# Patient Record
Sex: Male | Born: 1965 | Race: White | Hispanic: No | Smoking: Current every day smoker
Health system: Southern US, Community
[De-identification: ages and names within clinical notes are randomized; demographics above are authoritative.]

## PROBLEM LIST (undated history)

## (undated) DIAGNOSIS — K219 Gastro-esophageal reflux disease without esophagitis: Secondary | ICD-10-CM

## (undated) HISTORY — PX: ANKLE FRACTURE SURGERY: SHX122

## (undated) HISTORY — PX: HAND SURGERY: SHX662

---

## 2016-08-16 ENCOUNTER — Other Ambulatory Visit: Payer: Self-pay | Admitting: Orthopedic Surgery

## 2016-09-21 ENCOUNTER — Encounter (HOSPITAL_COMMUNITY)
Admission: RE | Admit: 2016-09-21 | Discharge: 2016-09-21 | Disposition: A | Discharge status: Home / Self Care | Payer: Worker's Compensation | Source: Ambulatory Visit | Attending: Orthopedic Surgery | Admitting: Orthopedic Surgery

## 2016-09-21 ENCOUNTER — Encounter (HOSPITAL_COMMUNITY): Payer: Self-pay

## 2016-09-21 ENCOUNTER — Ambulatory Visit (HOSPITAL_COMMUNITY)
Admission: RE | Admit: 2016-09-21 | Discharge: 2016-09-21 | Disposition: A | Discharge status: Home / Self Care | Payer: Worker's Compensation | Source: Ambulatory Visit | Attending: Orthopedic Surgery | Admitting: Orthopedic Surgery

## 2016-09-21 DIAGNOSIS — Z01810 Encounter for preprocedural cardiovascular examination: Secondary | ICD-10-CM | POA: Diagnosis not present

## 2016-09-21 DIAGNOSIS — Z01818 Encounter for other preprocedural examination: Secondary | ICD-10-CM

## 2016-09-21 DIAGNOSIS — M5412 Radiculopathy, cervical region: Secondary | ICD-10-CM | POA: Diagnosis not present

## 2016-09-21 HISTORY — DX: Gastro-esophageal reflux disease without esophagitis: K21.9

## 2016-09-21 LAB — COMPREHENSIVE METABOLIC PANEL
ALK PHOS: 41 U/L (ref 38–126)
ALT: 31 U/L (ref 17–63)
AST: 30 U/L (ref 15–41)
Albumin: 4.5 g/dL (ref 3.5–5.0)
Anion gap: 14 (ref 5–15)
BUN: 9 mg/dL (ref 6–20)
CALCIUM: 10 mg/dL (ref 8.9–10.3)
CO2: 22 mmol/L (ref 22–32)
CREATININE: 0.87 mg/dL (ref 0.61–1.24)
Chloride: 105 mmol/L (ref 101–111)
Glucose, Bld: 96 mg/dL (ref 65–99)
Potassium: 4.2 mmol/L (ref 3.5–5.1)
SODIUM: 141 mmol/L (ref 135–145)
Total Bilirubin: 0.6 mg/dL (ref 0.3–1.2)
Total Protein: 7.3 g/dL (ref 6.5–8.1)

## 2016-09-21 LAB — URINALYSIS, ROUTINE W REFLEX MICROSCOPIC
Bacteria, UA: NONE SEEN
Bilirubin Urine: NEGATIVE
GLUCOSE, UA: NEGATIVE mg/dL
Hgb urine dipstick: NEGATIVE
Ketones, ur: NEGATIVE mg/dL
Leukocytes, UA: NEGATIVE
Nitrite: NEGATIVE
Protein, ur: NEGATIVE mg/dL
SQUAMOUS EPITHELIAL / LPF: NONE SEEN
Specific Gravity, Urine: 1.019 (ref 1.005–1.030)
pH: 6 (ref 5.0–8.0)

## 2016-09-21 LAB — CBC WITH DIFFERENTIAL/PLATELET
Basophils Absolute: 0 10*3/uL (ref 0.0–0.1)
Basophils Relative: 1 %
Eosinophils Absolute: 0.3 10*3/uL (ref 0.0–0.7)
Eosinophils Relative: 4 %
HEMATOCRIT: 47 % (ref 39.0–52.0)
HEMOGLOBIN: 16.4 g/dL (ref 13.0–17.0)
LYMPHS ABS: 2 10*3/uL (ref 0.7–4.0)
LYMPHS PCT: 30 %
MCH: 31.5 pg (ref 26.0–34.0)
MCHC: 34.9 g/dL (ref 30.0–36.0)
MCV: 90.2 fL (ref 78.0–100.0)
Monocytes Absolute: 0.6 10*3/uL (ref 0.1–1.0)
Monocytes Relative: 9 %
NEUTROS ABS: 3.8 10*3/uL (ref 1.7–7.7)
NEUTROS PCT: 56 %
Platelets: 231 10*3/uL (ref 150–400)
RBC: 5.21 MIL/uL (ref 4.22–5.81)
RDW: 12.5 % (ref 11.5–15.5)
WBC: 6.6 10*3/uL (ref 4.0–10.5)

## 2016-09-21 LAB — PROTIME-INR
INR: 0.88
Prothrombin Time: 11.9 seconds (ref 11.4–15.2)

## 2016-09-21 LAB — APTT: aPTT: 27 seconds (ref 24–36)

## 2016-09-21 LAB — SURGICAL PCR SCREEN
MRSA, PCR: NEGATIVE
Staphylococcus aureus: NEGATIVE

## 2016-09-21 NOTE — Pre-Procedure Instructions (Signed)
    Filbert BertholdDaniel Bondar  09/21/2016      [facility] [place] [address] [place] [id number] Phone: [phone] Fax: [phone]    Your procedure is scheduled on 09/27/16.  [facility] at [facility].M.  Call this number if you have problems the morning of surgery:  [phone]   Remember:  Do not eat food or drink liquids after midnight.  Take these medicines the morning of surgery with A SIP OF WATER --ultram,zantac   Do not wear jewelry, make-up or nail polish.  Do not wear lotions, powders, or perfumes, or deoderant.  Do not shave 48 hours prior to surgery.  Men may shave face and neck.  Do not bring valuables to the hospital.  [facility] is not responsible for any belongings or valuables.  Contacts, dentures or bridgework may not be worn into surgery.  Leave your suitcase in the car.  After surgery it may be brought to your room.  For patients admitted to the hospital, discharge time will be determined by your treatment team.  Patients discharged the day of surgery will not be allowed to drive home.   Name and phone number of your driver:   Special instructions:  Do not take any aspirin,anti-inflammatories,vitamins,or herbal supplements 5-7 days prior to surgery.  Please read over the following fact sheets that you were given. MRSA Information

## 2016-09-27 ENCOUNTER — Encounter (HOSPITAL_COMMUNITY)
Admission: RE | Disposition: A | Discharge status: Home / Self Care | Payer: Self-pay | Source: Ambulatory Visit | Attending: Orthopedic Surgery

## 2016-09-27 ENCOUNTER — Ambulatory Visit (HOSPITAL_COMMUNITY): Payer: Worker's Compensation | Admitting: Anesthesiology

## 2016-09-27 ENCOUNTER — Ambulatory Visit (HOSPITAL_COMMUNITY): Payer: Worker's Compensation

## 2016-09-27 ENCOUNTER — Encounter (HOSPITAL_COMMUNITY): Payer: Self-pay | Admitting: *Deleted

## 2016-09-27 ENCOUNTER — Observation Stay (HOSPITAL_COMMUNITY)
Admission: RE | Admit: 2016-09-27 | Discharge: 2016-09-28 | Disposition: A | Discharge status: Home / Self Care | Payer: Worker's Compensation | Source: Ambulatory Visit | Attending: Orthopedic Surgery | Admitting: Orthopedic Surgery

## 2016-09-27 DIAGNOSIS — F17290 Nicotine dependence, other tobacco product, uncomplicated: Secondary | ICD-10-CM | POA: Diagnosis not present

## 2016-09-27 DIAGNOSIS — Z419 Encounter for procedure for purposes other than remedying health state, unspecified: Secondary | ICD-10-CM

## 2016-09-27 DIAGNOSIS — M50123 Cervical disc disorder at C6-C7 level with radiculopathy: Principal | ICD-10-CM | POA: Insufficient documentation

## 2016-09-27 DIAGNOSIS — K219 Gastro-esophageal reflux disease without esophagitis: Secondary | ICD-10-CM | POA: Insufficient documentation

## 2016-09-27 DIAGNOSIS — M5410 Radiculopathy, site unspecified: Secondary | ICD-10-CM | POA: Diagnosis present

## 2016-09-27 HISTORY — PX: ANTERIOR CERVICAL DECOMP/DISCECTOMY FUSION: SHX1161

## 2016-09-27 SURGERY — ANTERIOR CERVICAL DECOMPRESSION/DISCECTOMY FUSION 3 LEVELS
Anesthesia: General | Site: Neck

## 2016-09-27 MED ORDER — ACETAMINOPHEN 325 MG PO TABS: 650.0000 mg | ORAL_TABLET | ORAL | Status: DC | PRN

## 2016-09-27 MED ORDER — MIDAZOLAM HCL 2 MG/2ML IJ SOLN
INTRAMUSCULAR | Status: AC
  Administered 2016-09-27: 2 mg via INTRAVENOUS
  Filled 2016-09-27: qty 2

## 2016-09-27 MED ORDER — FLEET ENEMA 7-19 GM/118ML RE ENEM: 1.0000 | ENEMA | Freq: Once | RECTAL | Status: DC | PRN

## 2016-09-27 MED ORDER — OXYCODONE-ACETAMINOPHEN 5-325 MG PO TABS
1.0000 | ORAL_TABLET | ORAL | Status: DC | PRN
  Administered 2016-09-28: 2 via ORAL
  Filled 2016-09-27: qty 2

## 2016-09-27 MED ORDER — CEFAZOLIN SODIUM-DEXTROSE 2-4 GM/100ML-% IV SOLN
INTRAVENOUS | Status: AC
  Filled 2016-09-27: qty 100

## 2016-09-27 MED ORDER — LACTATED RINGERS IV SOLN
INTRAVENOUS | Status: DC
  Administered 2016-09-27: 11:00:00 via INTRAVENOUS

## 2016-09-27 MED ORDER — SODIUM CHLORIDE 0.9 % IV SOLN
250.0000 mL | INTRAVENOUS | Status: DC
  Administered 2016-09-27: 250 mL via INTRAVENOUS

## 2016-09-27 MED ORDER — SENNOSIDES-DOCUSATE SODIUM 8.6-50 MG PO TABS: 1.0000 | ORAL_TABLET | Freq: Every evening | ORAL | Status: DC | PRN

## 2016-09-27 MED ORDER — DIAZEPAM 5 MG PO TABS
5.0000 mg | ORAL_TABLET | Freq: Four times a day (QID) | ORAL | Status: DC | PRN
  Administered 2016-09-27: 5 mg via ORAL

## 2016-09-27 MED ORDER — ALUM & MAG HYDROXIDE-SIMETH 200-200-20 MG/5ML PO SUSP: 30.0000 mL | Freq: Four times a day (QID) | ORAL | Status: DC | PRN

## 2016-09-27 MED ORDER — THROMBIN 20000 UNITS EX SOLR
CUTANEOUS | Status: AC
  Filled 2016-09-27: qty 20000

## 2016-09-27 MED ORDER — CEFAZOLIN SODIUM-DEXTROSE 2-4 GM/100ML-% IV SOLN
2.0000 g | Freq: Three times a day (TID) | INTRAVENOUS | Status: AC
  Administered 2016-09-27 – 2016-09-28 (×2): 2 g via INTRAVENOUS
  Filled 2016-09-27 (×2): qty 100

## 2016-09-27 MED ORDER — HYDROMORPHONE HCL 1 MG/ML IJ SOLN
INTRAMUSCULAR | Status: AC
  Filled 2016-09-27: qty 0.5

## 2016-09-27 MED ORDER — ONDANSETRON HCL 4 MG/2ML IJ SOLN
INTRAMUSCULAR | Status: DC | PRN
  Administered 2016-09-27: 4 mg via INTRAVENOUS

## 2016-09-27 MED ORDER — EPINEPHRINE PF 1 MG/ML IJ SOLN
INTRAMUSCULAR | Status: AC
  Filled 2016-09-27: qty 1

## 2016-09-27 MED ORDER — DIAZEPAM 5 MG PO TABS
ORAL_TABLET | ORAL | Status: AC
  Filled 2016-09-27: qty 1

## 2016-09-27 MED ORDER — MIDAZOLAM HCL 2 MG/2ML IJ SOLN
INTRAMUSCULAR | Status: AC
  Filled 2016-09-27: qty 2

## 2016-09-27 MED ORDER — PROPOFOL 10 MG/ML IV BOLUS
INTRAVENOUS | Status: DC | PRN
  Administered 2016-09-27: 30 mg via INTRAVENOUS
  Administered 2016-09-27: 40 mg via INTRAVENOUS
  Administered 2016-09-27: 130 mg via INTRAVENOUS

## 2016-09-27 MED ORDER — BISACODYL 5 MG PO TBEC: 5.0000 mg | DELAYED_RELEASE_TABLET | Freq: Every day | ORAL | Status: DC | PRN

## 2016-09-27 MED ORDER — FENTANYL CITRATE (PF) 100 MCG/2ML IJ SOLN
INTRAMUSCULAR | Status: AC
  Filled 2016-09-27: qty 2

## 2016-09-27 MED ORDER — LIDOCAINE HCL (CARDIAC) 20 MG/ML IV SOLN
INTRAVENOUS | Status: DC | PRN
  Administered 2016-09-27: 40 mg via INTRAVENOUS

## 2016-09-27 MED ORDER — HEMOSTATIC AGENTS (NO CHARGE) OPTIME
TOPICAL | Status: DC | PRN
  Administered 2016-09-27: 1 via TOPICAL

## 2016-09-27 MED ORDER — CEFAZOLIN SODIUM-DEXTROSE 2-4 GM/100ML-% IV SOLN
2.0000 g | INTRAVENOUS | Status: AC
  Administered 2016-09-27: 2 g via INTRAVENOUS

## 2016-09-27 MED ORDER — HYDROMORPHONE HCL 1 MG/ML IJ SOLN
0.2500 mg | INTRAMUSCULAR | Status: DC | PRN
  Administered 2016-09-27 (×4): 0.5 mg via INTRAVENOUS

## 2016-09-27 MED ORDER — PROPOFOL 10 MG/ML IV BOLUS
INTRAVENOUS | Status: AC
  Filled 2016-09-27: qty 20

## 2016-09-27 MED ORDER — BUPIVACAINE HCL (PF) 0.25 % IJ SOLN
INTRAMUSCULAR | Status: AC
  Filled 2016-09-27: qty 30

## 2016-09-27 MED ORDER — BUPIVACAINE-EPINEPHRINE 0.25% -1:200000 IJ SOLN
INTRAMUSCULAR | Status: DC | PRN
  Administered 2016-09-27: 10 mL

## 2016-09-27 MED ORDER — HYDROCODONE-ACETAMINOPHEN 5-325 MG PO TABS: 1.0000 | ORAL_TABLET | ORAL | Status: DC | PRN

## 2016-09-27 MED ORDER — ROCURONIUM BROMIDE 50 MG/5ML IV SOSY
PREFILLED_SYRINGE | INTRAVENOUS | Status: AC
  Filled 2016-09-27: qty 5

## 2016-09-27 MED ORDER — 0.9 % SODIUM CHLORIDE (POUR BTL) OPTIME
TOPICAL | Status: DC | PRN
  Administered 2016-09-27 (×2): 1000 mL

## 2016-09-27 MED ORDER — ONDANSETRON HCL 4 MG/2ML IJ SOLN: 4.0000 mg | INTRAMUSCULAR | Status: DC | PRN

## 2016-09-27 MED ORDER — LIDOCAINE 2% (20 MG/ML) 5 ML SYRINGE
INTRAMUSCULAR | Status: AC
  Filled 2016-09-27: qty 5

## 2016-09-27 MED ORDER — FENTANYL CITRATE (PF) 100 MCG/2ML IJ SOLN
INTRAMUSCULAR | Status: DC | PRN
  Administered 2016-09-27 (×2): 50 ug via INTRAVENOUS
  Administered 2016-09-27: 100 ug via INTRAVENOUS
  Administered 2016-09-27 (×4): 50 ug via INTRAVENOUS

## 2016-09-27 MED ORDER — MIDAZOLAM HCL 5 MG/5ML IJ SOLN
INTRAMUSCULAR | Status: DC | PRN
  Administered 2016-09-27: 2 mg via INTRAVENOUS

## 2016-09-27 MED ORDER — MIDAZOLAM HCL 2 MG/2ML IJ SOLN: 0.5000 mg | Freq: Once | INTRAMUSCULAR | Status: DC | PRN

## 2016-09-27 MED ORDER — SUGAMMADEX SODIUM 200 MG/2ML IV SOLN
INTRAVENOUS | Status: DC | PRN
  Administered 2016-09-27: 162.4 mg via INTRAVENOUS

## 2016-09-27 MED ORDER — PHENOL 1.4 % MT LIQD: 1.0000 | OROMUCOSAL | Status: DC | PRN

## 2016-09-27 MED ORDER — MENTHOL 3 MG MT LOZG: 1.0000 | LOZENGE | OROMUCOSAL | Status: DC | PRN

## 2016-09-27 MED ORDER — THROMBIN 20000 UNITS EX KIT
PACK | CUTANEOUS | Status: DC | PRN
  Administered 2016-09-27: 20000 [IU] via TOPICAL

## 2016-09-27 MED ORDER — ACETAMINOPHEN 650 MG RE SUPP: 650.0000 mg | RECTAL | Status: DC | PRN

## 2016-09-27 MED ORDER — ZOLPIDEM TARTRATE 5 MG PO TABS: 5.0000 mg | ORAL_TABLET | Freq: Every evening | ORAL | Status: DC | PRN

## 2016-09-27 MED ORDER — MORPHINE SULFATE (PF) 2 MG/ML IV SOLN
1.0000 mg | INTRAVENOUS | Status: DC | PRN
  Administered 2016-09-27 – 2016-09-28 (×3): 2 mg via INTRAVENOUS
  Filled 2016-09-27 (×3): qty 1

## 2016-09-27 MED ORDER — PANTOPRAZOLE SODIUM 40 MG IV SOLR
40.0000 mg | Freq: Every day | INTRAVENOUS | Status: DC
  Administered 2016-09-27: 40 mg via INTRAVENOUS
  Filled 2016-09-27: qty 40

## 2016-09-27 MED ORDER — SODIUM CHLORIDE 0.9% FLUSH
3.0000 mL | Freq: Two times a day (BID) | INTRAVENOUS | Status: DC
  Administered 2016-09-27: 3 mL via INTRAVENOUS

## 2016-09-27 MED ORDER — LACTATED RINGERS IV SOLN
INTRAVENOUS | Status: DC | PRN
  Administered 2016-09-27 (×2): via INTRAVENOUS

## 2016-09-27 MED ORDER — ROCURONIUM BROMIDE 100 MG/10ML IV SOLN
INTRAVENOUS | Status: DC | PRN
  Administered 2016-09-27: 50 mg via INTRAVENOUS

## 2016-09-27 MED ORDER — BUPIVACAINE LIPOSOME 1.3 % IJ SUSP
20.0000 mL | INTRAMUSCULAR | Status: DC
  Filled 2016-09-27: qty 20

## 2016-09-27 MED ORDER — ONDANSETRON HCL 4 MG/2ML IJ SOLN
INTRAMUSCULAR | Status: AC
  Filled 2016-09-27: qty 2

## 2016-09-27 MED ORDER — DOCUSATE SODIUM 100 MG PO CAPS
100.0000 mg | ORAL_CAPSULE | Freq: Two times a day (BID) | ORAL | Status: DC
  Administered 2016-09-27: 100 mg via ORAL
  Filled 2016-09-27: qty 1

## 2016-09-27 MED ORDER — SUGAMMADEX SODIUM 200 MG/2ML IV SOLN
INTRAVENOUS | Status: AC
  Filled 2016-09-27: qty 2

## 2016-09-27 MED ORDER — POVIDONE-IODINE 7.5 % EX SOLN
Freq: Once | CUTANEOUS | Status: DC
  Filled 2016-09-27: qty 118

## 2016-09-27 MED ORDER — MEPERIDINE HCL 25 MG/ML IJ SOLN: 6.2500 mg | INTRAMUSCULAR | Status: DC | PRN

## 2016-09-27 MED ORDER — PROMETHAZINE HCL 25 MG/ML IJ SOLN: 6.2500 mg | INTRAMUSCULAR | Status: DC | PRN

## 2016-09-27 MED ORDER — FAMOTIDINE 20 MG PO TABS: 20.0000 mg | ORAL_TABLET | Freq: Every day | ORAL | Status: DC

## 2016-09-27 MED ORDER — SODIUM CHLORIDE 0.9% FLUSH: 3.0000 mL | INTRAVENOUS | Status: DC | PRN

## 2016-09-27 SURGICAL SUPPLY — 76 items
BENZOIN TINCTURE PRP APPL 2/3 (GAUZE/BANDAGES/DRESSINGS) ×3 IMPLANT
BIT DRILL NEURO 2X3.1 SFT TUCH (MISCELLANEOUS) ×1 IMPLANT
BIT DRILL SRG 14X2.2XFLT CHK (BIT) ×1 IMPLANT
BIT DRL SRG 14X2.2XFLT CHK (BIT) ×1
BLADE CLIPPER SURG (BLADE) ×3 IMPLANT
BLADE SURG 15 STRL LF DISP TIS (BLADE) ×1 IMPLANT
BLADE SURG 15 STRL SS (BLADE) ×2
BLADE SURG ROTATE 9660 (MISCELLANEOUS) ×3 IMPLANT
BONE VIVIGEN FORMABLE 5.4CC (Bone Implant) ×3 IMPLANT
CANISTER SUCT 3000ML (MISCELLANEOUS) ×3 IMPLANT
CARTRIDGE OIL MAESTRO DRILL (MISCELLANEOUS) ×1 IMPLANT
CLOSURE WOUND 1/2 X4 (GAUZE/BANDAGES/DRESSINGS) ×1
COVER SURGICAL LIGHT HANDLE (MISCELLANEOUS) ×3 IMPLANT
CRADLE DONUT ADULT HEAD (MISCELLANEOUS) ×3 IMPLANT
DECANTER SPIKE VIAL GLASS SM (MISCELLANEOUS) ×3 IMPLANT
DIFFUSER DRILL AIR PNEUMATIC (MISCELLANEOUS) ×3 IMPLANT
DRAIN JACKSON RD 7FR 3/32 (WOUND CARE) IMPLANT
DRAPE C-ARM 42X72 X-RAY (DRAPES) ×3 IMPLANT
DRAPE POUCH INSTRU U-SHP 10X18 (DRAPES) ×3 IMPLANT
DRAPE SURG 17X23 STRL (DRAPES) ×9 IMPLANT
DRILL BIT SKYLINE 14MM (BIT) ×2
DRILL NEURO 2X3.1 SOFT TOUCH (MISCELLANEOUS) ×3
DURAPREP 6ML APPLICATOR 50/CS (WOUND CARE) ×3 IMPLANT
ELECT COATED BLADE 2.86 ST (ELECTRODE) ×3 IMPLANT
ELECT REM PT RETURN 9FT ADLT (ELECTROSURGICAL) ×3
ELECTRODE REM PT RTRN 9FT ADLT (ELECTROSURGICAL) ×1 IMPLANT
EVACUATOR SILICONE 100CC (DRAIN) IMPLANT
GAUZE SPONGE 4X4 12PLY STRL (GAUZE/BANDAGES/DRESSINGS) ×3 IMPLANT
GAUZE SPONGE 4X4 16PLY XRAY LF (GAUZE/BANDAGES/DRESSINGS) ×3 IMPLANT
GLOVE BIO SURGEON STRL SZ7 (GLOVE) ×3 IMPLANT
GLOVE BIO SURGEON STRL SZ8 (GLOVE) ×3 IMPLANT
GLOVE BIOGEL PI IND STRL 7.0 (GLOVE) ×1 IMPLANT
GLOVE BIOGEL PI IND STRL 8 (GLOVE) ×1 IMPLANT
GLOVE BIOGEL PI INDICATOR 7.0 (GLOVE) ×2
GLOVE BIOGEL PI INDICATOR 8 (GLOVE) ×2
GOWN STRL REUS W/ TWL LRG LVL3 (GOWN DISPOSABLE) ×1 IMPLANT
GOWN STRL REUS W/ TWL XL LVL3 (GOWN DISPOSABLE) ×1 IMPLANT
GOWN STRL REUS W/TWL LRG LVL3 (GOWN DISPOSABLE) ×2
GOWN STRL REUS W/TWL XL LVL3 (GOWN DISPOSABLE) ×2
INTERLOCK LRDTC CRVCL VBR 6MM (Bone Implant) ×2 IMPLANT
INTERLOCK LRDTC CRVCL VBR 7MM (Bone Implant) ×1 IMPLANT
IV CATH 14GX2 1/4 (CATHETERS) ×3 IMPLANT
KIT BASIN OR (CUSTOM PROCEDURE TRAY) ×3 IMPLANT
KIT ROOM TURNOVER OR (KITS) ×3 IMPLANT
LORDOTIC CERVICAL VBR 6MM SM (Bone Implant) ×6 IMPLANT
LORDOTIC CERVICAL VBR 7MM SM (Bone Implant) ×3 IMPLANT
NEEDLE PRECISIONGLIDE 27X1.5 (NEEDLE) ×3 IMPLANT
NEEDLE SPNL 20GX3.5 QUINCKE YW (NEEDLE) ×3 IMPLANT
NS IRRIG 1000ML POUR BTL (IV SOLUTION) ×3 IMPLANT
OIL CARTRIDGE MAESTRO DRILL (MISCELLANEOUS) ×3
PACK ORTHO CERVICAL (CUSTOM PROCEDURE TRAY) ×3 IMPLANT
PAD ARMBOARD 7.5X6 YLW CONV (MISCELLANEOUS) ×3 IMPLANT
PATTIES SURGICAL .5 X.5 (GAUZE/BANDAGES/DRESSINGS) IMPLANT
PATTIES SURGICAL .5 X1 (DISPOSABLE) IMPLANT
PIN DISTRACTION 14 (PIN) ×6 IMPLANT
PLATE SKYLINE 3LVL 45MM CERV (Plate) ×3 IMPLANT
SCREW SKYLINE VAR OS 14MM (Screw) ×24 IMPLANT
SPONGE GAUZE 4X4 12PLY STER LF (GAUZE/BANDAGES/DRESSINGS) ×3 IMPLANT
SPONGE INTESTINAL PEANUT (DISPOSABLE) ×3 IMPLANT
SPONGE SURGIFOAM ABS GEL 100 (HEMOSTASIS) ×3 IMPLANT
STRIP CLOSURE SKIN 1/2X4 (GAUZE/BANDAGES/DRESSINGS) ×2 IMPLANT
SURGIFLO W/THROMBIN 8M KIT (HEMOSTASIS) IMPLANT
SUT MNCRL AB 4-0 PS2 18 (SUTURE) ×3 IMPLANT
SUT SILK 4 0 (SUTURE) ×2
SUT SILK 4-0 18XBRD TIE 12 (SUTURE) ×1 IMPLANT
SUT VIC AB 2-0 CT2 18 VCP726D (SUTURE) ×3 IMPLANT
SYR BULB IRRIGATION 50ML (SYRINGE) ×3 IMPLANT
SYR CONTROL 10ML LL (SYRINGE) ×3 IMPLANT
TAPE CLOTH 4X10 WHT NS (GAUZE/BANDAGES/DRESSINGS) ×3 IMPLANT
TAPE CLOTH SURG 4X10 WHT LF (GAUZE/BANDAGES/DRESSINGS) ×3 IMPLANT
TAPE UMBILICAL COTTON 1/8X30 (MISCELLANEOUS) ×3 IMPLANT
TOWEL OR 17X24 6PK STRL BLUE (TOWEL DISPOSABLE) ×3 IMPLANT
TOWEL OR 17X26 10 PK STRL BLUE (TOWEL DISPOSABLE) ×3 IMPLANT
TRAY FOLEY CATH 16FRSI W/METER (SET/KITS/TRAYS/PACK) ×3 IMPLANT
WATER STERILE IRR 1000ML POUR (IV SOLUTION) ×3 IMPLANT
YANKAUER SUCT BULB TIP NO VENT (SUCTIONS) ×3 IMPLANT

## 2016-09-27 NOTE — H&P (Signed)
     PREOPERATIVE H&P  Chief Complaint: Left arm pain  HPI: Luis Gonzalez is a 51 y.o. male who presents with ongoing pain in the left arm dating back to a work injury dated 11/12/2013.  MRI reveals NF stenosis spanning C4-C7, correlating to his pain.  Patient has failed multiple forms of conservative care and continues to have pain (see office notes for additional details regarding the patient's full course of treatment)  Past Medical History:  Diagnosis Date  . GERD (gastroesophageal reflux disease)    Past Surgical History:  Procedure Laterality Date  . ANKLE FRACTURE SURGERY    . HAND SURGERY     x3   Social History   Social History  . Marital status: Divorced    Spouse name: N/A  . Number of children: N/A  . Years of education: N/A   Social History Main Topics  . Smoking status: Current Every Day Smoker    Years: 45.00  . Smokeless tobacco: Current User    Types: Snuff  . Alcohol use Yes     Comment: occ  . Drug use: No  . Sexual activity: Not on file   Other Topics Concern  . Not on file   Social History Narrative  . No narrative on file   No family history on file. Allergies  Allergen Reactions  . Bee Venom Shortness Of Breath    burning   Prior to Admission medications   Medication Sig Start Date End Date Taking? Authorizing Provider  ranitidine (ZANTAC) 150 MG tablet Take 300 mg by mouth at bedtime.   Yes Historical Provider, MD  traMADol (ULTRAM) 50 MG tablet Take 50 mg by mouth every 12 (twelve) hours as needed for moderate pain.   Yes Historical Provider, MD     All other systems have been reviewed and were otherwise negative with the exception of those mentioned in the HPI and as above.  Physical Exam: There were no vitals filed for this visit.  General: Alert, no acute distress Cardiovascular: No pedal edema Respiratory: No cyanosis, no use of accessory musculature Skin: No lesions in the area of chief complaint Neurologic: Sensation  intact distally Psychiatric: Patient is competent for consent with normal mood and affect Lymphatic: No axillary or cervical lymphadenopathy  MUSCULOSKELETAL: + spurlilng's on the left  Assessment/Plan: Left cervical radiculopathy Plan for Procedure(s): ANTERIOR CERVICAL DECOMPRESSION FUSION, CERVICAL 4-5, CERVICAL 5-, CERVICAL 6-7 WITH INSTRUMENTATION AND ALLOGRAFT   Emilee HeroUMONSKI,Luis Mish LEONARD, MD 09/27/2016 7:15 AM

## 2016-09-27 NOTE — Anesthesia Procedure Notes (Signed)
Procedure Name: Intubation Date/Time: 09/27/2016 1:45 PM Performed by: Manus Gunning, Chee Dimon J Pre-anesthesia Checklist: Patient identified, Emergency Drugs available, Suction available, Patient being monitored and Timeout performed Patient Re-evaluated:Patient Re-evaluated prior to inductionOxygen Delivery Method: Circle system utilized Preoxygenation: Pre-oxygenation with 100% oxygen Intubation Type: IV induction Ventilation: Mask ventilation without difficulty Laryngoscope Size: Mac and 3 Grade View: Grade I Tube type: Oral Tube size: 7.5 mm Number of attempts: 1 Placement Confirmation: ETT inserted through vocal cords under direct vision,  positive ETCO2 and breath sounds checked- equal and bilateral Secured at: 21 cm Tube secured with: Tape Dental Injury: Teeth and Oropharynx as per pre-operative assessment

## 2016-09-27 NOTE — Op Note (Signed)
Date of Surgery: 09/27/2016  PREOPERATIVE DIAGNOSES: 1. Left-sided cervical radiculopathy. 2. Neuroforaminal stenosis spanning C4-C7 3. Severe DDD C4-C7  POSTOPERATIVE DIAGNOSES: 1. Left-sided cervical radiculopathy. 2. Neuroforaminal stenosis spanning C4-C7 3. Severe DDD C4-C7  PROCEDURE: 1. Anterior cervical decompression and fusion C4/5, C5/6, C6/7 2. Placement of anterior instrumentation, C4-C7 3. Insertion of interbody device x 3 (Titan intervertebral spacers). 4. Use of morselized allograft - Vivagen 5. Intraoperative use of fluoroscopy.  SURGEON: Estill Bamberg, MD.  ASSISTANTDorathy Daft McKenzie PA-C.  ANESTHESIA: General endotracheal anesthesia.  COMPLICATIONS: None.  DISPOSITION: Stable.  ESTIMATED BLOOD LOSS: Minimal.  INDICATIONS FOR SURGERY: Briefly, Mr. Briles is a pleasant 51 year old male, who did present to me with a history of ongoing pain in the left arm s/p a work injury that occurred on 11/12/2013. An MRI did reveal the findings noted above.  Given the  patient's ongoing pain and the findings on his MRI and lack of improvement with  Appropriate nonoperative measures, we did discuss proceeding with the procedure noted above. The patient was fully aware of the risks and limitations of the surgery, and did elect to proceed.  OPERATIVE DETAILS: On 09/27/2016, the patient was brought to surgery and general endotracheal anesthesia was administered. The patient was placed supine on the hospital bed. The patient's neck was gently extended. The patient's arms were secured to his sides. All bony prominences were padded. The neck was prepped and draped. A left-sided transverse incision was then made. The platysma was incised. A Clementeen Graham approach was utilized. A self-retaining retractor was placed and the vertebral bodies to be fused were subperiosteally exposed. Caspar pins were then placed into the C6 and C7 vertebral bodies  and distraction was applied. I then proceeded with a thorough and complete C6/7 intervertebral diskectomy. I was very pleased with the decompression  that I was able to accomplish. An appropriate b/l decompression was confirmed  using a nerve hook. The endplates were then prepared and the appropriate sized interbody spacer was packed with Vivagen  and tamped into position in the usual fashion. The lower Caspar pin was removed and bone wax was then placed in its place. A new Caspar pin was placed into the C5 vertebral body and distraction was applied across the C5/6 intervertebral space. Once again, a thorough diskectomy was performed. There was no stenosis noted at the completion of the diskectomy, this was confirmed using a nerve hook bilaterally. The endplates were then prepared and the appropriate sized interbody spacer was packed with Vivagen and tamped into position in the usual fashion. The lower Caspar pin was removed and bone wax was placed in its place. A new Caspar pin was placed into the C4 vertebral body and again, distraction was applied across the C4/5 intervertebral space. Once again, a thorough and complete C4/5 intervertebral diskectomy and decompression was performed. The endplates were then prepared. The appropriate sized interbody spacer was then packed with Vivagen and tamped into position in the usual fashion. I was very pleased with the press-fit of the spacers. The Caspar pins were removed and bone wax was placed in their place. I then selected the appropriate sized anterior cervical plate, which was placed over the anterior spine. 14 mm variable angle screws were placed, 2 in each vertebral body from C4 to C7 for a total of 8 vertebral body screws. The screws were then locked to the plate using the Cam locking mechanism. I was very pleased with the purchase of each of the screws. I was very pleased  with the final fluoroscopic images. The  wound was then irrigated. All bleeding was controlled using bipolar electrocautery. The wound was then closed in layers. The platysma was closed using 2-0 Vicryl and the skin was closed using 4-0 Monocryl. Benzoin and Steri-Strips were then applied followed by sterile dressing. All instrument counts were correct at the termination of the procedure.  Of note, Jason CoopKayla McKenzie was my assistant throughout surgery, and did aid in retraction, suctioning, and closure from start to finish.     Estill BambergMark Dhana Totton, MD

## 2016-09-27 NOTE — Anesthesia Postprocedure Evaluation (Addendum)
Anesthesia Post Note  Patient: Luis RompDaniel L Gonzalez  Procedure(s) Performed: Procedure(s) (LRB): ANTERIOR CERVICAL DECOMPRESSION FUSION, CERVICAL 4-5, CERVICAL 5-6, CERVICAL 6-7 WITH INSTRUMENTATION AND ALLOGRAFT (N/A)  Patient location during evaluation: PACU Anesthesia Type: General Level of consciousness: awake, awake and alert and oriented Pain management: pain level controlled Vital Signs Assessment: post-procedure vital signs reviewed and stable Respiratory status: spontaneous breathing, nonlabored ventilation and respiratory function stable Cardiovascular status: blood pressure returned to baseline Anesthetic complications: no       Last Vitals:  Vitals:   09/27/16 1710 09/27/16 1715  BP: (!) 160/103 (!) 165/107  Pulse: 97 88  Resp: 12 14  Temp: 36.1 C     Last Pain:  Vitals:   09/27/16 1039  TempSrc:   PainSc: 4                  Rashid Whitenight COKER

## 2016-09-27 NOTE — Transfer of Care (Signed)
Immediate Anesthesia Transfer of Care Note  Patient: Luis RompDaniel L Traywick  Procedure(s) Performed: Procedure(s) with comments: ANTERIOR CERVICAL DECOMPRESSION FUSION, CERVICAL 4-5, CERVICAL 5-6, CERVICAL 6-7 WITH INSTRUMENTATION AND ALLOGRAFT (N/A) - ANTERIOR CERVICAL DECOMPRESSION FUSION, CERVICAL 4-5, CERVICAL 5-, CERVICAL 6-7 WITH INSTRUMENTATION AND ALLOGRAFT  Patient Location: PACU  Anesthesia Type:General  Level of Consciousness: awake  Airway & Oxygen Therapy: Patient Spontanous Breathing  Post-op Assessment: Report given to RN and Post -op Vital signs reviewed and stable  Post vital signs: Reviewed and stable  Last Vitals:  Vitals:   09/27/16 1024 09/27/16 1710  BP: 123/90 (!) 160/103  Pulse: (!) 58 97  Resp: 20 12  Temp: 36.7 C 36.1 C    Last Pain:  Vitals:   09/27/16 1039  TempSrc:   PainSc: 4          Complications: No apparent anesthesia complications

## 2016-09-27 NOTE — Anesthesia Preprocedure Evaluation (Addendum)
Anesthesia Evaluation  Patient identified by MRN, date of birth, ID band Patient awake    Reviewed: Allergy & Precautions, NPO status , Patient's Chart, lab work & pertinent test results  History of Anesthesia Complications Negative for: history of anesthetic complications  Airway Mallampati: II  TM Distance: >3 FB Neck ROM: Full    Dental  (+) Dental Advisory Given   Pulmonary Current Smoker,    breath sounds clear to auscultation       Cardiovascular negative cardio ROS   Rhythm:Regular Rate:Normal     Neuro/Psych Cervical radiculopathy    GI/Hepatic Neg liver ROS, GERD  Medicated and Controlled,  Endo/Other  negative endocrine ROS  Renal/GU negative Renal ROS     Musculoskeletal   Abdominal   Peds  Hematology negative hematology ROS (+)   Anesthesia Other Findings   Reproductive/Obstetrics                            Anesthesia Physical Anesthesia Plan  ASA: II  Anesthesia Plan: General   Post-op Pain Management:    Induction: Intravenous  Airway Management Planned: Oral ETT  Additional Equipment:   Intra-op Plan:   Post-operative Plan: Extubation in OR  Informed Consent: I have reviewed the patients History and Physical, chart, labs and discussed the procedure including the risks, benefits and alternatives for the proposed anesthesia with the patient or authorized representative who has indicated his/her understanding and acceptance.   Dental advisory given  Plan Discussed with: CRNA and Surgeon  Anesthesia Plan Comments: (Plan routine monitors, GETA)        Anesthesia Quick Evaluation

## 2016-09-28 DIAGNOSIS — M50123 Cervical disc disorder at C6-C7 level with radiculopathy: Secondary | ICD-10-CM | POA: Diagnosis not present

## 2016-09-28 MED FILL — Thrombin For Soln 20000 Unit: CUTANEOUS | Qty: 1 | Status: AC

## 2016-09-28 NOTE — Progress Notes (Signed)
Pt discharged home. Discharged instructions were reviewed with the patient. Pt verbalized understanding.

## 2016-09-28 NOTE — Progress Notes (Signed)
    Patient doing well Has been ambulating Tolerating PO well   Physical Exam: Vitals:   09/28/16 0200 09/28/16 0500  BP: 133/83 125/87  Pulse: (!) 109 (!) 110  Resp: 20 18  Temp: 99.1 F (37.3 C) 99 F (37.2 C)   Neck soft/spple Dressing in place NVI  POD #1 s/p C4-7 ACDF, doing well  - encourage ambulation - Percocet for pain, Valium for muscle spasms - likely d/c home later today with f/u in 2 weeks

## 2016-09-28 NOTE — Progress Notes (Signed)
Orthopedic Tech Progress Note Patient Details:  Luis RompDaniel L Gonzalez 22-Jan-1966 [id number]  Ortho Devices Type of Ortho Device: [place] cervical collar Ortho Device/Splint Interventions: Application   Saul FordyceJennifer C Krissa Utke 09/28/2016, 8:44 AM

## 2016-09-29 ENCOUNTER — Encounter (HOSPITAL_COMMUNITY): Payer: Self-pay | Admitting: Orthopedic Surgery

## 2016-10-05 NOTE — Discharge Summary (Signed)
Patient ID: Luis Gonzalez MRN: [id number] DOB/AGE: 10/20/1965 51 y.o.  Admit date: 09/27/2016 Discharge date: 09/28/2016  Admission Diagnoses:  Active Problems:   Radiculopathy   Discharge Diagnoses:  Same  Past Medical History:  Diagnosis Date  . GERD (gastroesophageal reflux disease)     Surgeries: Procedure(s): ANTERIOR CERVICAL DECOMPRESSION FUSION, CERVICAL 4-5, CERVICAL 5-6, CERVICAL 6-7 WITH INSTRUMENTATION AND ALLOGRAFT on 09/27/2016   Consultants:   Discharged Condition: Improved  Hospital Course: Luis Gonzalez is an 51 y.o. male who was admitted 09/27/2016 for operative treatment of radiculopathy. Patient has severe unremitting pain that affects sleep, daily activities, and work/hobbies. After pre-op clearance the patient was taken to the operating room on 09/27/2016 and underwent  Procedure(s): ANTERIOR CERVICAL DECOMPRESSION FUSION, CERVICAL 4-5, CERVICAL 5-6, CERVICAL 6-7 WITH INSTRUMENTATION AND ALLOGRAFT.    Patient was given perioperative antibiotics:  Anti-infectives    Start     Dose/Rate Route Frequency Ordered Stop   09/27/16 2100  ceFAZolin (ANCEF) IVPB 2g/100 mL premix     2 g 200 mL/hr over 30 Minutes Intravenous Every 8 hours 09/27/16 2008 09/28/16 0613   09/27/16 1025  ceFAZolin (ANCEF) 2-4 GM/100ML-% IVPB    Comments:  Block, Sarah   : cabinet override      09/27/16 1025 09/27/16 1333   09/27/16 1018  ceFAZolin (ANCEF) IVPB 2g/100 mL premix     2 g 200 mL/hr over 30 Minutes Intravenous On call to O.R. 09/27/16 1018 09/27/16 1333       Patient was given sequential compression devices, early ambulation to prevent DVT.  Patient benefited maximally from hospital stay and there were no complications.    Recent vital signs: BP 125/87 (BP Location: Right Arm)   Pulse (!) 110   Temp 99 F (37.2 C) (Oral)   Resp 18   Ht 5\' 5"  (1.651 m)   Wt 84.3 kg (185 lb 13.6 oz)   SpO2 96%   BMI 30.93 kg/m   Discharge Medications:   Allergies as of  09/28/2016      Reactions   Bee Venom Shortness Of Breath   burning      Medication List    STOP taking these medications   traMADol 50 MG tablet Commonly known as:  ULTRAM     TAKE these medications   ranitidine 150 MG tablet Commonly known as:  ZANTAC Take 300 mg by mouth at bedtime.       Diagnostic Studies: Dg Chest 2 View  Result Date: 09/21/2016 CLINICAL DATA:  Neck fusion . EXAM: CHEST  2 VIEW COMPARISON:  No recent prior . FINDINGS: Mediastinum and hilar structures normal. Lungs are clear. Heart size normal. No pleural effusion or pneumothorax . IMPRESSION: No acute cardiopulmonary disease. Electronically Signed   By: Maisie Fus  Register   On: 09/21/2016 16:00   Dg Cervical Spine 1 View  Result Date: 09/27/2016 CLINICAL DATA:  Anterior cervical spinal fusion at C4-C7. Initial encounter. EXAM: DG CERVICAL SPINE - 1 VIEW COMPARISON:  None. FINDINGS: A single fluoroscopic C-arm image is provided from the OR, demonstrating placement of anterior cervical spinal fusion hardware at C4-C7, and associated spacers. There is no evidence of fracture or subluxation, though the lower cervical spine is incompletely assessed on this image. IMPRESSION: Status post anterior cervical spinal fusion at C4-C7. Electronically Signed   By: Roanna Raider M.D.   On: 09/27/2016 17:23   Dg C-arm 1-60 Min  Result Date: 09/27/2016 CLINICAL DATA:  Anterior cervical spinal fusion  at C4-C7. Initial encounter. EXAM: DG CERVICAL SPINE - 1 VIEW COMPARISON:  None. FINDINGS: A single fluoroscopic C-arm image is provided from the OR, demonstrating placement of anterior cervical spinal fusion hardware at C4-C7, and associated spacers. There is no evidence of fracture or subluxation, though the lower cervical spine is incompletely assessed on this image. IMPRESSION: Status post anterior cervical spinal fusion at C4-C7. Electronically Signed   By: Roanna RaiderJeffery  Chang M.D.   On: 09/27/2016 17:23    Disposition: 01-Home or  Self Care   POD #1 s/p C4-7 ACDF, doing well  - encourage ambulation -Written scripts for pain signed and in chart -D/C instructions sheet printed and in chart -D/C today  -F/U in office 2 weeks   Signed: Georga BoraMCKENZIE, Shaunita Seney J 10/05/2016, 12:30 PM

## 2017-04-19 NOTE — Addendum Note (Signed)
Addendum  created 04/19/17 1027 by Lenus Trauger, MD   Sign clinical note    

## 2018-01-10 IMAGING — RF DG C-ARM 61-120 MIN
1 series · 1 of 1 positions shown · non-contrast
Comparison: None.

CLINICAL DATA: Anterior cervical spinal fusion at C4-C7. Initial
encounter.

EXAM:
DG CERVICAL SPINE - 1 VIEW

[Series 1: run · 1 of 1 slices shown]
[im 1/1]
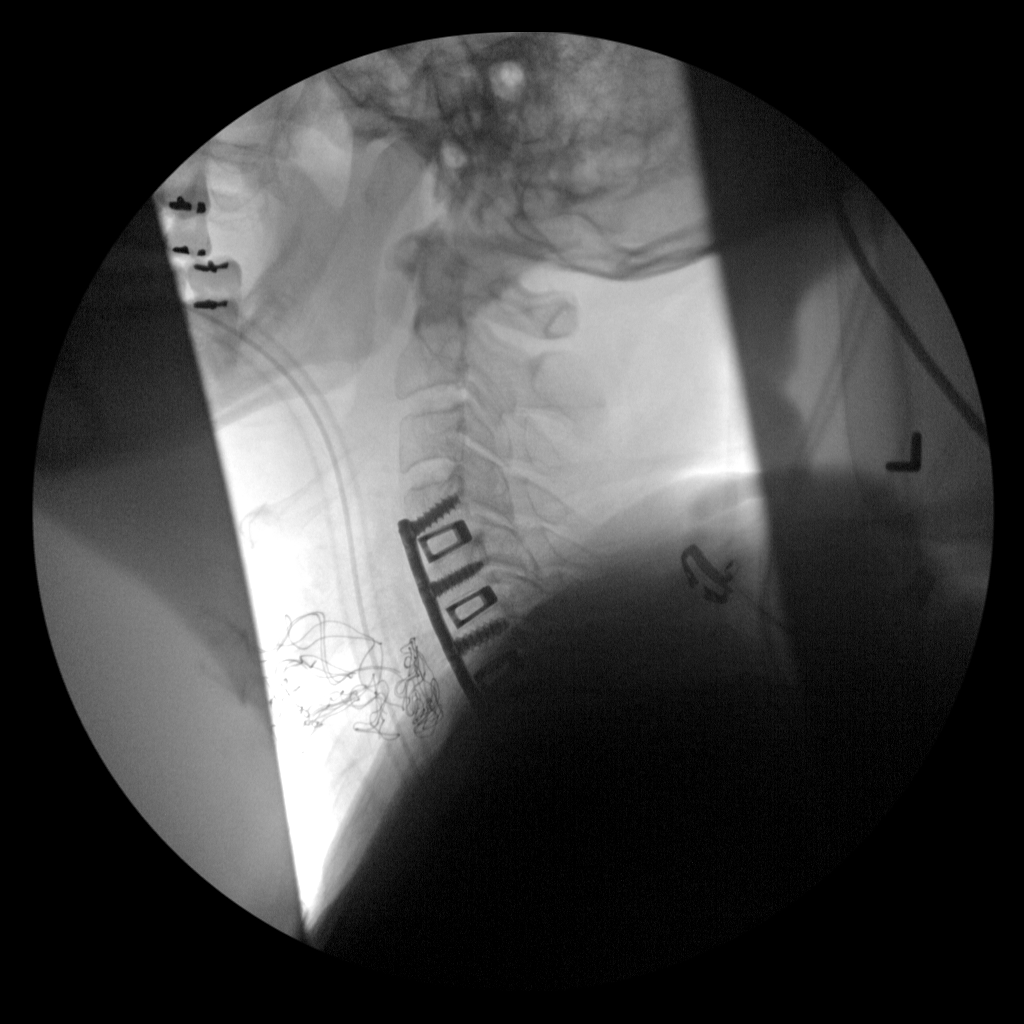

[1 of 1 positions shown; findings below may reference images not displayed]

FINDINGS: A single fluoroscopic C-arm image is provided from the OR,
demonstrating placement of anterior cervical spinal fusion hardware
at C4-C7, and associated spacers. There is no evidence of fracture
or subluxation, though the lower cervical spine is incompletely
assessed on this image.
IMPRESSION: Status post anterior cervical spinal fusion at C4-C7.
